# Patient Record
Sex: Male | Born: 1985 | Race: White | Hispanic: No | Smoking: Never smoker
Health system: Southern US, Community
[De-identification: ages and names within clinical notes are randomized; demographics above are authoritative.]

---

## 2003-04-11 HISTORY — PX: LEG SURGERY: SHX1003

## 2014-07-18 ENCOUNTER — Emergency Department (HOSPITAL_COMMUNITY): Payer: Worker's Compensation

## 2014-07-18 ENCOUNTER — Encounter (HOSPITAL_COMMUNITY): Payer: Self-pay | Admitting: Emergency Medicine

## 2014-07-18 ENCOUNTER — Emergency Department (HOSPITAL_COMMUNITY)
Admission: EM | Admit: 2014-07-18 | Discharge: 2014-07-18 | Disposition: A | Discharge status: Home / Self Care | Payer: Worker's Compensation | Attending: Emergency Medicine | Admitting: Emergency Medicine

## 2014-07-18 DIAGNOSIS — Y9389 Activity, other specified: Secondary | ICD-10-CM | POA: Insufficient documentation

## 2014-07-18 DIAGNOSIS — S20211A Contusion of right front wall of thorax, initial encounter: Secondary | ICD-10-CM | POA: Insufficient documentation

## 2014-07-18 DIAGNOSIS — Y998 Other external cause status: Secondary | ICD-10-CM | POA: Insufficient documentation

## 2014-07-18 DIAGNOSIS — Y92410 Unspecified street and highway as the place of occurrence of the external cause: Secondary | ICD-10-CM | POA: Insufficient documentation

## 2014-07-18 LAB — PROTIME-INR
INR: 1.05 (ref 0.00–1.49)
PROTHROMBIN TIME: 13.8 s (ref 11.6–15.2)

## 2014-07-18 LAB — TYPE AND SCREEN
ABO/RH(D): O POS
Antibody Screen: NEGATIVE
Unit division: 0
Unit division: 0

## 2014-07-18 LAB — CBC
HEMATOCRIT: 40.1 % (ref 39.0–52.0)
Hemoglobin: 13.1 g/dL (ref 13.0–17.0)
MCH: 26.8 pg (ref 26.0–34.0)
MCHC: 32.7 g/dL (ref 30.0–36.0)
MCV: 82.2 fL (ref 78.0–100.0)
Platelets: 222 10*3/uL (ref 150–400)
RBC: 4.88 MIL/uL (ref 4.22–5.81)
RDW: 13.6 % (ref 11.5–15.5)
WBC: 7.6 10*3/uL (ref 4.0–10.5)

## 2014-07-18 LAB — I-STAT CHEM 8, ED
BUN: 9 mg/dL (ref 6–23)
CHLORIDE: 103 mmol/L (ref 96–112)
CREATININE: 0.7 mg/dL (ref 0.50–1.35)
Calcium, Ion: 1.13 mmol/L (ref 1.12–1.23)
Glucose, Bld: 105 mg/dL — ABNORMAL HIGH (ref 70–99)
HCT: 42 % (ref 39.0–52.0)
Hemoglobin: 14.3 g/dL (ref 13.0–17.0)
Potassium: 3.9 mmol/L (ref 3.5–5.1)
SODIUM: 138 mmol/L (ref 135–145)
TCO2: 22 mmol/L (ref 0–100)

## 2014-07-18 LAB — COMPREHENSIVE METABOLIC PANEL
ALK PHOS: 56 U/L (ref 39–117)
ALT: 40 U/L (ref 0–53)
AST: 29 U/L (ref 0–37)
Albumin: 3.8 g/dL (ref 3.5–5.2)
Anion gap: 9 (ref 5–15)
BUN: 8 mg/dL (ref 6–23)
CO2: 24 mmol/L (ref 19–32)
Calcium: 8.8 mg/dL (ref 8.4–10.5)
Chloride: 102 mmol/L (ref 96–112)
Creatinine, Ser: 0.82 mg/dL (ref 0.50–1.35)
GFR calc non Af Amer: >90 mL/min (ref >90)
Glucose, Bld: 106 mg/dL — ABNORMAL HIGH (ref 70–99)
Potassium: 3.8 mmol/L (ref 3.5–5.1)
Sodium: 135 mmol/L (ref 135–145)
TOTAL PROTEIN: 6.8 g/dL (ref 6.0–8.3)
Total Bilirubin: 0.7 mg/dL (ref 0.3–1.2)

## 2014-07-18 LAB — PREPARE FRESH FROZEN PLASMA
Unit division: 0
Unit division: 0

## 2014-07-18 LAB — I-STAT CG4 LACTIC ACID, ED: Lactic Acid, Venous: 1.79 mmol/L (ref 0.5–2.0)

## 2014-07-18 LAB — ETHANOL

## 2014-07-18 LAB — ABO/RH: ABO/RH(D): O POS

## 2014-07-18 MED ORDER — IOHEXOL 300 MG/ML  SOLN
100.0000 mL | Freq: Once | INTRAMUSCULAR | Status: AC | PRN
  Administered 2014-07-18: 100 mL via INTRAVENOUS

## 2014-07-18 MED ORDER — HYDROCODONE-ACETAMINOPHEN 5-325 MG PO TABS: 1.0000 | ORAL_TABLET | ORAL | Status: AC | PRN

## 2014-07-18 MED ORDER — METHOCARBAMOL 500 MG PO TABS: 1000.0000 mg | ORAL_TABLET | Freq: Three times a day (TID) | ORAL | Status: AC | PRN

## 2014-07-18 MED ORDER — IBUPROFEN 600 MG PO TABS: 600.0000 mg | ORAL_TABLET | Freq: Four times a day (QID) | ORAL | Status: AC | PRN

## 2014-07-18 MED ORDER — METHOCARBAMOL 500 MG PO TABS
1000.0000 mg | ORAL_TABLET | Freq: Once | ORAL | Status: AC
  Administered 2014-07-18: 1000 mg via ORAL
  Filled 2014-07-18: qty 2

## 2014-07-18 MED ORDER — IBUPROFEN 400 MG PO TABS
600.0000 mg | ORAL_TABLET | Freq: Once | ORAL | Status: AC
  Administered 2014-07-18: 600 mg via ORAL
  Filled 2014-07-18 (×2): qty 1

## 2014-07-18 MED ORDER — FENTANYL CITRATE 0.05 MG/ML IJ SOLN
50.0000 ug | Freq: Once | INTRAMUSCULAR | Status: AC
Start: 2014-07-18 — End: 2014-07-18
  Administered 2014-07-18: 50 ug via INTRAVENOUS

## 2014-07-18 NOTE — ED Notes (Signed)
Pt transported to CT ?

## 2014-07-18 NOTE — ED Notes (Signed)
This RN spoke with pt's wife at his request, and told her that the pt was here and the pt would like her to come to the hospital.

## 2014-07-18 NOTE — ED Notes (Signed)
No neuro consult, no ortho consult.
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
## 2014-07-18 NOTE — ED Notes (Signed)
Patient transported to X-ray 
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
## 2014-07-18 NOTE — ED Notes (Signed)
50 mcg of fentanyl wasted with witness Elliot GurneyWoody, Charity fundraiserN. Unable to waste in pyxis.

## 2014-07-18 NOTE — Discharge Instructions (Signed)
Chest Contusion °A chest contusion is a deep bruise on your chest area. Contusions are the result of an injury that caused bleeding under the skin. A chest contusion may involve bruising of the skin, muscles, or ribs. The contusion may turn blue, purple, or yellow. Minor injuries will give you a painless contusion, but more severe contusions may stay painful and swollen for a few weeks. °CAUSES  °A contusion is usually caused by a blow, trauma, or direct force to an area of the body. °SYMPTOMS  °· Swelling and redness of the injured area. °· Discoloration of the injured area. °· Tenderness and soreness of the injured area. °· Pain. °DIAGNOSIS  °The diagnosis can be made by taking a history and performing a physical exam. An X-ray, CT scan, or MRI may be needed to determine if there were any associated injuries, such as broken bones (fractures) or internal injuries. °TREATMENT  °Often, the best treatment for a chest contusion is resting, icing, and applying cold compresses to the injured area. Deep breathing exercises may be recommended to reduce the risk of pneumonia. Over-the-counter medicines may also be recommended for pain control. °HOME CARE INSTRUCTIONS  °· Put ice on the injured area. °· Put ice in a plastic bag. °· Place a towel between your skin and the bag. °· Leave the ice on for 15-20 minutes, 03-04 times a day. °· Only take over-the-counter or prescription medicines as directed by your caregiver. Your caregiver may recommend avoiding anti-inflammatory medicines (aspirin, ibuprofen, and naproxen) for 48 hours because these medicines may increase bruising. °· Rest the injured area. °· Perform deep-breathing exercises as directed by your caregiver. °· Stop smoking if you smoke. °· Do not lift objects over 5 pounds (2.3 kg) for 3 days or longer if recommended by your caregiver. °SEEK IMMEDIATE MEDICAL CARE IF:  °· You have increased bruising or swelling. °· You have pain that is getting worse. °· You have  difficulty breathing. °· You have dizziness, weakness, or fainting. °· You have blood in your urine or stool. °· You cough up or vomit blood. °· Your swelling or pain is not relieved with medicines. °MAKE SURE YOU:  °· Understand these instructions. °· Will watch your condition. °· Will get help right away if you are not doing well or get worse. °Document Released: 12/20/2000 Document Revised: 12/20/2011 Document Reviewed: 09/18/2011 °[employer]® Patient Information ©2015 [employer]. This information is not intended to replace advice given to you by your health care provider. Make sure you discuss any questions you have with your health care provider. ° °Motor Vehicle Collision °It is common to have multiple bruises and sore muscles after a motor vehicle collision (MVC). These tend to feel worse for the first 24 hours. You may have the most stiffness and soreness over the first several hours. You may also feel worse when you wake up the first morning after your collision. After this point, you will usually begin to improve with each day. The speed of improvement often depends on the severity of the collision, the number of injuries, and the location and nature of these injuries. °HOME CARE INSTRUCTIONS °· Put ice on the injured area. °¨ Put ice in a plastic bag. °¨ Place a towel between your skin and the bag. °¨ Leave the ice on for 15-20 minutes, 3-4 times a day, or as directed by your health care provider. °· Drink enough fluids to keep your urine clear or pale yellow. Do not drink alcohol. °· Take a   warm shower or bath once or twice a day. This will increase blood flow to sore muscles. °· You may return to activities as directed by your caregiver. Be careful when lifting, as this may aggravate neck or back pain. °· Only take over-the-counter or prescription medicines for pain, discomfort, or fever as directed by your caregiver. Do not use aspirin. This may increase bruising and bleeding. °SEEK IMMEDIATE MEDICAL  CARE IF: °· You have numbness, tingling, or weakness in the arms or legs. °· You develop severe headaches not relieved with medicine. °· You have severe neck pain, especially tenderness in the middle of the back of your neck. °· You have changes in bowel or bladder control. °· There is increasing pain in any area of the body. °· You have shortness of breath, light-headedness, dizziness, or fainting. °· You have chest pain. °· You feel sick to your stomach (nauseous), throw up (vomit), or sweat. °· You have increasing abdominal discomfort. °· There is blood in your urine, stool, or vomit. °· You have pain in your shoulder (shoulder strap areas). °· You feel your symptoms are getting worse. °MAKE SURE YOU: °· Understand these instructions. °· Will watch your condition. °· Will get help right away if you are not doing well or get worse. °Document Released: 03/27/2005 Document Revised: 08/11/2013 Document Reviewed: 08/24/2010 °[employer]® Patient Information ©2015 [employer]. This information is not intended to replace advice given to you by your health care provider. Make sure you discuss any questions you have with your health care provider. ° °
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
## 2022-02-17 ENCOUNTER — Other Ambulatory Visit (HOSPITAL_BASED_OUTPATIENT_CLINIC_OR_DEPARTMENT_OTHER): Payer: Self-pay
